# Patient Record
Sex: Male | Born: 1955 | Race: White | Hispanic: No | Marital: Married | Smoking: Former smoker
Health system: Southern US, Community
[De-identification: ages and names within clinical notes are randomized; demographics above are authoritative.]

## PROBLEM LIST (undated history)

## (undated) DIAGNOSIS — L0390 Cellulitis, unspecified: Secondary | ICD-10-CM

## (undated) DIAGNOSIS — E119 Type 2 diabetes mellitus without complications: Secondary | ICD-10-CM

## (undated) DIAGNOSIS — I10 Essential (primary) hypertension: Secondary | ICD-10-CM

## (undated) DIAGNOSIS — A4902 Methicillin resistant Staphylococcus aureus infection, unspecified site: Secondary | ICD-10-CM

## (undated) HISTORY — PX: VASECTOMY: SHX75

## (undated) HISTORY — PX: TONSILLECTOMY: SUR1361

---

## 2012-01-17 ENCOUNTER — Emergency Department (HOSPITAL_COMMUNITY)
Admission: EM | Admit: 2012-01-17 | Discharge: 2012-01-18 | Disposition: A | Discharge status: Home / Self Care | Payer: Self-pay | Attending: Emergency Medicine | Admitting: Emergency Medicine

## 2012-01-17 ENCOUNTER — Encounter (HOSPITAL_COMMUNITY): Payer: Self-pay | Admitting: Emergency Medicine

## 2012-01-17 DIAGNOSIS — I10 Essential (primary) hypertension: Secondary | ICD-10-CM | POA: Insufficient documentation

## 2012-01-17 DIAGNOSIS — Z8614 Personal history of Methicillin resistant Staphylococcus aureus infection: Secondary | ICD-10-CM | POA: Insufficient documentation

## 2012-01-17 DIAGNOSIS — E119 Type 2 diabetes mellitus without complications: Secondary | ICD-10-CM | POA: Insufficient documentation

## 2012-01-17 DIAGNOSIS — L0390 Cellulitis, unspecified: Secondary | ICD-10-CM

## 2012-01-17 DIAGNOSIS — X58XXXA Exposure to other specified factors, initial encounter: Secondary | ICD-10-CM | POA: Insufficient documentation

## 2012-01-17 DIAGNOSIS — Y9389 Activity, other specified: Secondary | ICD-10-CM | POA: Insufficient documentation

## 2012-01-17 DIAGNOSIS — Y929 Unspecified place or not applicable: Secondary | ICD-10-CM | POA: Insufficient documentation

## 2012-01-17 DIAGNOSIS — L02419 Cutaneous abscess of limb, unspecified: Secondary | ICD-10-CM | POA: Insufficient documentation

## 2012-01-17 DIAGNOSIS — Z79899 Other long term (current) drug therapy: Secondary | ICD-10-CM | POA: Insufficient documentation

## 2012-01-17 HISTORY — DX: Methicillin resistant Staphylococcus aureus infection, unspecified site: A49.02

## 2012-01-17 HISTORY — DX: Type 2 diabetes mellitus without complications: E11.9

## 2012-01-17 HISTORY — DX: Essential (primary) hypertension: I10

## 2012-01-17 HISTORY — DX: Cellulitis, unspecified: L03.90

## 2012-01-17 NOTE — ED Notes (Signed)
Pt states he skinned his left lower leg last week and he just wants it to be checked  Pt states he has a hx of MRSA and cellulitis  Pt states he has been keeping it covered and using antiseptic ointment on it

## 2012-01-18 MED ORDER — DOXYCYCLINE HYCLATE 100 MG PO TABS: 100.0000 mg | ORAL_TABLET | Freq: Two times a day (BID) | ORAL | Status: DC

## 2012-01-18 MED ORDER — DOXYCYCLINE HYCLATE 100 MG PO TABS
100.0000 mg | ORAL_TABLET | Freq: Once | ORAL | Status: AC
  Administered 2012-01-18: 100 mg via ORAL
  Filled 2012-01-18: qty 1

## 2012-01-18 MED ORDER — HYDROCODONE-ACETAMINOPHEN 5-500 MG PO TABS: 1.0000 | ORAL_TABLET | Freq: Four times a day (QID) | ORAL | Status: DC | PRN

## 2012-01-18 NOTE — ED Provider Notes (Signed)
Medical screening examination/treatment/procedure(s) were performed by non-physician practitioner and as supervising physician I was immediately available for consultation/collaboration.  Cheri Guppy, MD 01/18/12 [phone]

## 2012-01-18 NOTE — ED Provider Notes (Signed)
History     CSN: [id number]  Arrival date & time 01/17/12  2252   First MD Initiated Contact with Patient 01/18/12 0146      Chief Complaint  Patient presents with  . Leg Injury    (Consider location/radiation/quality/duration/timing/severity/associated sxs/prior treatment) HPI Comments: States that he has recurrent lower leg.  Infections, possibly 7 days ago.  He scraped the anterior portion of his left shin.  Since that time.  He has had some progressive redness, swelling, that he has been treating with Bactroban erythema.  Has spread to the entire lower leg with a small amount swelling.  He denies any fever, myalgias, change in his normal blood sugars, which run between 120 and 140 he does have a primary care physician, but has been unable to see them due to the physicians recent surgery.  The history is provided by the patient.    Past Medical History  Diagnosis Date  . MRSA (methicillin resistant Staphylococcus aureus)   . Cellulitis   . Diabetes mellitus without complication   . Hypertension     Past Surgical History  Procedure Date  . Tonsillectomy   . Vasectomy     Family History  Problem Relation Age of Onset  . Cancer Other   . Diabetes Other     History  Substance Use Topics  . Smoking status: Never Smoker   . Smokeless tobacco: Not on file  . Alcohol Use: Yes     occ      Review of Systems  Constitutional: Negative for fever and chills.  Musculoskeletal: Negative for myalgias and joint swelling.  Skin: Positive for wound. Negative for rash.  Neurological: Negative for dizziness.    Allergies  Azithromycin; Levaquin; and Penicillins  Home Medications   Current Outpatient Rx  Name Route Sig Dispense Refill  . CARVEDILOL 3.125 MG PO TABS Oral Take 3.125 mg by mouth 2 (two) times daily with a meal.    . COLESEVELAM HCL 625 MG PO TABS Oral Take 1,875 mg by mouth 2 (two) times daily with a meal.    . FENOFIBRATE 145 MG PO TABS Oral Take 145 mg  by mouth daily.    Marland Kitchen GABAPENTIN 100 MG PO CAPS Oral Take 100 mg by mouth 2 (two) times daily.    Marland Kitchen GLIPIZIDE 5 MG PO TABS Oral Take 2.5 mg by mouth 2 (two) times daily before a meal.    . HYDROCHLOROTHIAZIDE 25 MG PO TABS Oral Take 25 mg by mouth daily.    . MELOXICAM 15 MG PO TABS Oral Take 15 mg by mouth daily.    Marland Kitchen METFORMIN HCL 1000 MG PO TABS Oral Take 1,000 mg by mouth 2 (two) times daily with a meal.    . NIFEDIPINE ER 30 MG PO TB24 Oral Take 30 mg by mouth daily.    . QUINAPRIL HCL 20 MG PO TABS Oral Take 20 mg by mouth at bedtime.    Marland Kitchen DOXYCYCLINE HYCLATE 100 MG PO TABS Oral Take 1 tablet (100 mg total) by mouth 2 (two) times daily. 14 tablet 0  . HYDROCODONE-ACETAMINOPHEN 5-500 MG PO TABS Oral Take 1 tablet by mouth every 6 (six) hours as needed for pain. 15 tablet 0    BP 136/49  Pulse 90  Temp 98.4 F (36.9 C) (Oral)  Resp 20  SpO2 93%  Physical Exam  Constitutional: He appears well-developed and well-nourished.       Morbidly obese  HENT:  Head: Normocephalic.  Eyes: Pupils  are equal, round, and reactive to light.  Cardiovascular: Normal rate.   Pulmonary/Chest: Effort normal.  Musculoskeletal: He exhibits edema and tenderness.       Legs:      Area of erythema, anterior shin scabs over approximately 1 cm x 3 cm, mid shaft  Neurological: He is alert.  Skin: Skin is warm.    ED Course  Procedures (including critical care time)  Labs Reviewed - No data to display No results found.   1. Cellulitis       MDM  Cellulitis of the left lower leg, with some swelling.  Patient is refusing admission at this time, stating he needs to be in court tomorrow.  He, states that he will return for further treatment if needed.  If he does not respond to the by mouth antibiotics         Arman Filter, NP 01/18/12 0210  Arman Filter, NP 01/18/12 0210

## 2012-08-30 ENCOUNTER — Encounter: Payer: Self-pay | Admitting: Gastroenterology

## 2012-10-02 ENCOUNTER — Ambulatory Visit: Payer: Medicare Other | Admitting: Gastroenterology

## 2015-12-23 ENCOUNTER — Ambulatory Visit (HOSPITAL_COMMUNITY)
Admission: EM | Admit: 2015-12-23 | Discharge: 2015-12-23 | Disposition: A | Discharge status: Home / Self Care | Payer: Medicare Other | Attending: Family Medicine | Admitting: Family Medicine

## 2015-12-23 ENCOUNTER — Encounter (HOSPITAL_COMMUNITY): Payer: Self-pay | Admitting: Emergency Medicine

## 2015-12-23 DIAGNOSIS — J069 Acute upper respiratory infection, unspecified: Secondary | ICD-10-CM

## 2015-12-23 DIAGNOSIS — J0100 Acute maxillary sinusitis, unspecified: Secondary | ICD-10-CM | POA: Diagnosis not present

## 2015-12-23 MED ORDER — CEPHALEXIN 500 MG PO CAPS: 500.0000 mg | ORAL_CAPSULE | Freq: Four times a day (QID) | ORAL | 0 refills | Status: DC

## 2015-12-23 NOTE — ED Triage Notes (Signed)
The patient presented to the [facility] with a complaint of nasal pain and pressure that he stated started 2 days ago and started to move into his chest and cause chest congestion.

## 2015-12-24 NOTE — ED Provider Notes (Signed)
CSN: [id number]     Arrival date & time 12/23/15  1631 History   First MD Initiated Contact with Patient 12/23/15 1750     Chief Complaint  Patient presents with  . Cough  . Facial Pain   (Consider location/radiation/quality/duration/timing/severity/associated sxs/prior Treatment) HPI 60 y/o male with sinus pressure and drainage. No relief with home treatments. Unable to get an appointment with PCP. States his doctor usually treats with antibiotics. Non smoker but significant smoke exposure.  Past Medical History:  Diagnosis Date  . Cellulitis   . Diabetes mellitus without complication ([place])   . Hypertension   . MRSA (methicillin resistant Staphylococcus aureus)    Past Surgical History:  Procedure Laterality Date  . TONSILLECTOMY    . VASECTOMY     Family History  Problem Relation Age of Onset  . Cancer Other   . Diabetes Other    Social History  Substance Use Topics  . Smoking status: Never Smoker  . Smokeless tobacco: Not on file  . Alcohol use Yes     Comment: occ    Review of Systems  Denies: HEADACHE, NAUSEA, ABDOMINAL PAIN, CHEST PAIN,  DYSURIA, SHORTNESS OF BREATH  Allergies  Azithromycin; Levaquin [levofloxacin]; and Penicillins  Home Medications   Prior to Admission medications   Medication Sig Start Date End Date Taking? Authorizing Provider  carvedilol (COREG) 3.125 MG tablet Take 3.125 mg by mouth 2 (two) times daily with a meal.    Historical Provider, MD  cephALEXin (KEFLEX) 500 MG capsule Take 1 capsule (500 mg total) by mouth 4 (four) times daily. 12/23/15   Konrad Felix, PA  colesevelam [facility]) 625 MG tablet Take 1,875 mg by mouth 2 (two) times daily with a meal.    Historical Provider, MD  doxycycline (VIBRA-TABS) 100 MG tablet Take 1 tablet (100 mg total) by mouth 2 (two) times daily. 01/18/12   Junius Creamer, NP  fenofibrate (TRICOR) 145 MG tablet Take 145 mg by mouth daily.    Historical Provider, MD  gabapentin (NEURONTIN) 100 MG capsule  Take 100 mg by mouth 2 (two) times daily.    Historical Provider, MD  glipiZIDE (GLUCOTROL) 5 MG tablet Take 2.5 mg by mouth 2 (two) times daily before a meal.    Historical Provider, MD  hydrochlorothiazide (HYDRODIURIL) 25 MG tablet Take 25 mg by mouth daily.    Historical Provider, MD  HYDROcodone-acetaminophen (VICODIN) 5-500 MG per tablet Take 1 tablet by mouth every 6 (six) hours as needed for pain. 01/18/12   Junius Creamer, NP  meloxicam (MOBIC) 15 MG tablet Take 15 mg by mouth daily.    Historical Provider, MD  metFORMIN (GLUCOPHAGE) 1000 MG tablet Take 1,000 mg by mouth 2 (two) times daily with a meal.    Historical Provider, MD  NIFEdipine (PROCARDIA-XL/ADALAT CC) 30 MG 24 hr tablet Take 30 mg by mouth daily.    Historical Provider, MD  quinapril (ACCUPRIL) 20 MG tablet Take 20 mg by mouth at bedtime.    Historical Provider, MD   Meds Ordered and Administered this Visit  Medications - No data to display  BP 121/55 (BP Location: Left Arm)   Pulse 82   Temp 98.7 F (37.1 C) (Oral)   Resp 12   SpO2 98%  No data found.   Physical Exam NURSES NOTES AND VITAL SIGNS REVIEWED. CONSTITUTIONAL: Well developed, well nourished, no acute distress HEENT: normocephalic, atraumatic, bilateral maxillary tenderness.  EYES: Conjunctiva normal NECK:normal ROM, supple, no adenopathy PULMONARY:No respiratory distress, normal  effort ABDOMINAL: Soft, ND, NT BS+, No CVAT MUSCULOSKELETAL: Normal ROM of all extremities,  SKIN: warm and dry without rash PSYCHIATRIC: Mood and affect, behavior are normal  Urgent Care Course   Clinical Course    Procedures (including critical care time)  Labs Review Labs Reviewed - No data to display  Imaging Review No results found.   Visual Acuity Review  Right Eye Distance:   Left Eye Distance:   Bilateral Distance:    Right Eye Near:   Left Eye Near:    Bilateral Near:         MDM   1. Acute upper respiratory infection   2. Acute  maxillary sinusitis, recurrence not specified     Patient is reassured that there are no issues that require transfer to higher level of care at this time or additional tests. Patient is advised to continue home symptomatic treatment. Patient is advised that if there are new or worsening symptoms to attend the emergency department, contact primary care provider, or return to UC. Instructions of care provided discharged home in stable condition.    THIS NOTE WAS GENERATED USING A VOICE RECOGNITION SOFTWARE PROGRAM. ALL REASONABLE EFFORTS  WERE MADE TO PROOFREAD THIS DOCUMENT FOR ACCURACY.  I have verbally reviewed the discharge instructions with the patient. A printed AVS was given to the patient.  All questions were answered prior to discharge.      Konrad Felix, [place] 12/24/15 [phone]

## 2016-01-01 ENCOUNTER — Emergency Department (HOSPITAL_BASED_OUTPATIENT_CLINIC_OR_DEPARTMENT_OTHER)
Admission: EM | Admit: 2016-01-01 | Discharge: 2016-01-01 | Disposition: A | Discharge status: Home / Self Care | Payer: Medicare Other | Attending: Emergency Medicine | Admitting: Emergency Medicine

## 2016-01-01 ENCOUNTER — Encounter (HOSPITAL_BASED_OUTPATIENT_CLINIC_OR_DEPARTMENT_OTHER): Payer: Self-pay | Admitting: *Deleted

## 2016-01-01 DIAGNOSIS — I10 Essential (primary) hypertension: Secondary | ICD-10-CM | POA: Insufficient documentation

## 2016-01-01 DIAGNOSIS — Z79899 Other long term (current) drug therapy: Secondary | ICD-10-CM | POA: Insufficient documentation

## 2016-01-01 DIAGNOSIS — Z7984 Long term (current) use of oral hypoglycemic drugs: Secondary | ICD-10-CM | POA: Insufficient documentation

## 2016-01-01 DIAGNOSIS — L03115 Cellulitis of right lower limb: Secondary | ICD-10-CM | POA: Diagnosis not present

## 2016-01-01 DIAGNOSIS — E119 Type 2 diabetes mellitus without complications: Secondary | ICD-10-CM | POA: Diagnosis not present

## 2016-01-01 DIAGNOSIS — M79604 Pain in right leg: Secondary | ICD-10-CM | POA: Diagnosis present

## 2016-01-01 LAB — CBG MONITORING, ED: Glucose-Capillary: 234 mg/dL — ABNORMAL HIGH (ref 65–99)

## 2016-01-01 MED ORDER — DOXYCYCLINE HYCLATE 100 MG PO TABS
100.0000 mg | ORAL_TABLET | Freq: Once | ORAL | Status: AC
  Administered 2016-01-01: 100 mg via ORAL
  Filled 2016-01-01: qty 1

## 2016-01-01 MED ORDER — DOXYCYCLINE HYCLATE 100 MG PO CAPS: 100.0000 mg | ORAL_CAPSULE | Freq: Two times a day (BID) | ORAL | 0 refills | Status: DC

## 2016-01-01 NOTE — ED Notes (Signed)
C/o rt lower leg pain  Area red and swollen on shin onset yesterday

## 2016-01-01 NOTE — ED Provider Notes (Signed)
[place] DEPT [facility] Provider Note   CSN: [id number] Arrival date & time: 01/01/16  2013    By signing my name below, I, Randall Morgan, attest that this documentation has been prepared under the direction and in the presence of Jola Schmidt, MD. Electronically signed, Randall Morgan, ED Scribe. 01/01/16. 8:51 PM.   History   Chief Complaint Chief Complaint  Patient presents with  . Leg Pain    The history is provided by the patient. No language interpreter was used.   HPI Comments: Randall Morgan is a 60 y.o. male who has a pmhx of DM and cellulitis presents to the Emergency Department complaining of Erythema of his right lower leg  x 24 hours after wresting with pets. Pt has associated right leg pain. He states that he has had cellulitis in the past and that his current symptoms feel the same. He is currently taking Keflex for a URI. He denies drainage or fever. The pt is allergic to septra and levaquin, but he can take doxycycline. No other associates symptoms. Pain is mild in severity   Past Medical History:  Diagnosis Date  . Cellulitis   . Diabetes mellitus without complication ([place])   . Hypertension   . MRSA (methicillin resistant Staphylococcus aureus)     There are no active problems to display for this patient.   Past Surgical History:  Procedure Laterality Date  . TONSILLECTOMY    . VASECTOMY         Home Medications    Prior to Admission medications   Medication Sig Start Date End Date Taking? Authorizing Provider  carvedilol (COREG) 3.125 MG tablet Take 3.125 mg by mouth 2 (two) times daily with a meal.    Historical Provider, MD  cephALEXin (KEFLEX) 500 MG capsule Take 1 capsule (500 mg total) by mouth 4 (four) times daily. 12/23/15   Konrad Felix, PA  colesevelam [facility]) 625 MG tablet Take 1,875 mg by mouth 2 (two) times daily with a meal.    Historical Provider, MD  doxycycline (VIBRA-TABS) 100 MG tablet Take 1 tablet (100 mg total) by mouth 2  (two) times daily. 01/18/12   Junius Creamer, NP  fenofibrate (TRICOR) 145 MG tablet Take 145 mg by mouth daily.    Historical Provider, MD  gabapentin (NEURONTIN) 100 MG capsule Take 100 mg by mouth 2 (two) times daily.    Historical Provider, MD  glipiZIDE (GLUCOTROL) 5 MG tablet Take 2.5 mg by mouth 2 (two) times daily before a meal.    Historical Provider, MD  hydrochlorothiazide (HYDRODIURIL) 25 MG tablet Take 25 mg by mouth daily.    Historical Provider, MD  HYDROcodone-acetaminophen (VICODIN) 5-500 MG per tablet Take 1 tablet by mouth every 6 (six) hours as needed for pain. 01/18/12   Junius Creamer, NP  meloxicam (MOBIC) 15 MG tablet Take 15 mg by mouth daily.    Historical Provider, MD  metFORMIN (GLUCOPHAGE) 1000 MG tablet Take 1,000 mg by mouth 2 (two) times daily with a meal.    Historical Provider, MD  NIFEdipine (PROCARDIA-XL/ADALAT CC) 30 MG 24 hr tablet Take 30 mg by mouth daily.    Historical Provider, MD  quinapril (ACCUPRIL) 20 MG tablet Take 20 mg by mouth at bedtime.    Historical Provider, MD    Family History Family History  Problem Relation Age of Onset  . Cancer Other   . Diabetes Other     Social History Social History  Substance Use Topics  . Smoking status:  Never Smoker  . Smokeless tobacco: Never Used  . Alcohol use Yes     Comment: occ     Allergies   Azithromycin; Levaquin [levofloxacin]; and Penicillins   Review of Systems Review of Systems  A complete 10 system review of systems was obtained and all systems are negative except as noted in the HPI and PMH.   Physical Exam Updated Vital Signs BP 157/65   Pulse 94   Temp 97.7 F (36.5 C) (Oral)   Resp 20   Ht 5\' 7"  (1.702 m)   Wt (!) 316 lb (143.3 kg)   SpO2 94%   BMI 49.49 kg/m   Physical Exam  Constitutional: He is oriented to person, place, and time. He appears well-developed and well-nourished.  HENT:  Head: Normocephalic.  Eyes: EOM are normal.  Neck: Normal range of motion.    Pulmonary/Chest: Effort normal.  Abdominal: He exhibits no distension.  Musculoskeletal: Normal range of motion.  Focal erythema of the mid anterior right tibial region. No drainage. No fluctuance. No crepitus. Normal pulses right foot. Compartments of right lower extremity are soft. This area is silver dollar sized.  Neurological: He is alert and oriented to person, place, and time.  Psychiatric: He has a normal mood and affect.  Nursing note and vitals reviewed.    ED Treatments / Results  DIAGNOSTIC STUDIES: Oxygen Saturation is 94% on RA, low by my interpretation.    COORDINATION OF CARE: 8:51 PM Will order medication. Discussed treatment plan with pt at bedside and pt agreed to plan.    Labs (all labs ordered are listed, but only abnormal results are displayed) Labs Reviewed - No data to display  EKG  EKG Interpretation None       Radiology No results found.  Procedures Procedures (including critical care time)  Medications Ordered in ED Medications - No data to display   Initial Impression / Assessment and Plan / ED Course  I have reviewed the triage vital signs and the nursing notes.  Pertinent labs & imaging results that were available during my care of the patient were reviewed by me and considered in my medical decision making (see chart for details).  Clinical Course    Overall well-appearing. No signs of suggest abscess at this time. Appears to be a superficial cellulitis. Patient be started on doxycycline. The erythema was outlined with a skin marker. He understands to return to the ER for new or worsening symptoms. I've asked him to take a picture of this when he returns home for further documentation purposes to see if the infection is worsening. He understands to return to the ER for new or worsening symptoms   I personally performed the services described in this documentation, which was scribed in my presence. The recorded information has been  reviewed and is accurate.      Final Clinical Impressions(s) / ED Diagnoses   Final diagnoses:  Cellulitis of right lower leg    New Prescriptions New Prescriptions   No medications on file     Jola Schmidt, MD 01/01/16 2104

## 2016-01-01 NOTE — ED Triage Notes (Signed)
Right lower leg is painful. Hx of cellulitis in the past. He also has a hx of MRSA.

## 2016-04-13 ENCOUNTER — Emergency Department (HOSPITAL_BASED_OUTPATIENT_CLINIC_OR_DEPARTMENT_OTHER)
Admission: EM | Admit: 2016-04-13 | Discharge: 2016-04-14 | Disposition: A | Discharge status: Home / Self Care | Payer: Medicare Other | Attending: Emergency Medicine | Admitting: Emergency Medicine

## 2016-04-13 ENCOUNTER — Emergency Department (HOSPITAL_BASED_OUTPATIENT_CLINIC_OR_DEPARTMENT_OTHER): Payer: Medicare Other

## 2016-04-13 ENCOUNTER — Encounter (HOSPITAL_BASED_OUTPATIENT_CLINIC_OR_DEPARTMENT_OTHER): Payer: Self-pay | Admitting: Emergency Medicine

## 2016-04-13 DIAGNOSIS — Y929 Unspecified place or not applicable: Secondary | ICD-10-CM | POA: Diagnosis not present

## 2016-04-13 DIAGNOSIS — W109XXA Fall (on) (from) unspecified stairs and steps, initial encounter: Secondary | ICD-10-CM | POA: Insufficient documentation

## 2016-04-13 DIAGNOSIS — Z7984 Long term (current) use of oral hypoglycemic drugs: Secondary | ICD-10-CM | POA: Diagnosis not present

## 2016-04-13 DIAGNOSIS — E119 Type 2 diabetes mellitus without complications: Secondary | ICD-10-CM | POA: Diagnosis not present

## 2016-04-13 DIAGNOSIS — Y9301 Activity, walking, marching and hiking: Secondary | ICD-10-CM | POA: Diagnosis not present

## 2016-04-13 DIAGNOSIS — S5002XA Contusion of left elbow, initial encounter: Secondary | ICD-10-CM | POA: Insufficient documentation

## 2016-04-13 DIAGNOSIS — I10 Essential (primary) hypertension: Secondary | ICD-10-CM | POA: Diagnosis not present

## 2016-04-13 DIAGNOSIS — S61011A Laceration without foreign body of right thumb without damage to nail, initial encounter: Secondary | ICD-10-CM | POA: Insufficient documentation

## 2016-04-13 DIAGNOSIS — W108XXA Fall (on) (from) other stairs and steps, initial encounter: Secondary | ICD-10-CM

## 2016-04-13 DIAGNOSIS — Z79899 Other long term (current) drug therapy: Secondary | ICD-10-CM | POA: Insufficient documentation

## 2016-04-13 DIAGNOSIS — Y999 Unspecified external cause status: Secondary | ICD-10-CM | POA: Diagnosis not present

## 2016-04-13 DIAGNOSIS — T07XXXA Unspecified multiple injuries, initial encounter: Secondary | ICD-10-CM

## 2016-04-13 DIAGNOSIS — W548XXA Other contact with dog, initial encounter: Secondary | ICD-10-CM

## 2016-04-13 MED ORDER — DOXYCYCLINE HYCLATE 100 MG PO TABS
100.0000 mg | ORAL_TABLET | Freq: Once | ORAL | Status: AC
  Administered 2016-04-13: 100 mg via ORAL
  Filled 2016-04-13: qty 1

## 2016-04-13 MED ORDER — HYDROCODONE-ACETAMINOPHEN 5-325 MG PO TABS
1.0000 | ORAL_TABLET | Freq: Once | ORAL | Status: AC
  Administered 2016-04-13: 1 via ORAL
  Filled 2016-04-13: qty 1

## 2016-04-13 MED ORDER — TETANUS-DIPHTH-ACELL PERTUSSIS 5-2.5-18.5 LF-MCG/0.5 IM SUSP
0.5000 mL | Freq: Once | INTRAMUSCULAR | Status: AC
  Administered 2016-04-13: 0.5 mL via INTRAMUSCULAR
  Filled 2016-04-13: qty 0.5

## 2016-04-13 MED ORDER — LIDOCAINE HCL (PF) 2 % IJ SOLN
INTRAMUSCULAR | Status: AC
  Filled 2016-04-13: qty 2

## 2016-04-13 MED ORDER — DOXYCYCLINE HYCLATE 100 MG PO CAPS: 100.0000 mg | ORAL_CAPSULE | Freq: Two times a day (BID) | ORAL | 0 refills | Status: DC

## 2016-04-13 NOTE — ED Notes (Signed)
Patient given instructions and wound care done.

## 2016-04-13 NOTE — ED Triage Notes (Addendum)
Pt has laceration to R thumb caused by dog leash. Pt was also pulled to the ground by the dog and reports bilat knee pain.

## 2016-04-13 NOTE — ED Provider Notes (Signed)
[place] DEPT [facility] Provider Note: Randall Spurling, MD, FACEP  By signing my name below, I, Dolores Hoose, attest that this documentation has been prepared under the direction and in the presence of Shanon Rosser, MD . Electronically Signed: Dolores Hoose, Scribe. 04/13/2016. 10:57 PM.  CSN: [id number] MRN: [id number] ARRIVAL: 04/13/16 at 1934 ROOM: [place] [facility]  Randall Morgan is a 61 y.o. male with pmhx of DM, MRSA, and cellulitis who presents to the Emergency Department complaining of an injury to his right thumb that occurred earlier today. Pt reports that he was walking his dog when the leash became caught on his hand as the dog pulled. A carabiner was caught on the dorsal surface of his right thumb lacerating it. There is an irregular flap laceration at that location. He denies any numbness or weakness of the thumb distally. When he fell he landed on his left elbow and knees bilaterally. He has mild pain to the sites. He denies neck or back pain. Bleeding is controlled. He is not up-to-date on tetanus. Pain is moderate, worse with movement or palpation of the thumb.  Past Medical History:  Diagnosis Date  . Cellulitis   . Diabetes mellitus without complication ([place])   . Hypertension   . MRSA (methicillin resistant Staphylococcus aureus)     Past Surgical History:  Procedure Laterality Date  . TONSILLECTOMY    . VASECTOMY     Family History  Problem Relation Age of Onset  . Cancer Other   . Diabetes Other    Social History  Substance Use Topics  . Smoking status: Never Smoker  . Smokeless tobacco: Never Used  . Alcohol use Yes     Comment: occ   Prior to Admission medications   Medication Sig Start Date End Date Taking? Authorizing Provider  carvedilol (COREG) 3.125 MG tablet Take 3.125 mg by mouth 2 (two) times daily with a meal.   Yes Historical Provider, MD  fenofibrate (TRICOR) 145 MG tablet Take 145  mg by mouth daily.   Yes Historical Provider, MD  gabapentin (NEURONTIN) 100 MG capsule Take 100 mg by mouth 2 (two) times daily.   Yes Historical Provider, MD  glipiZIDE (GLUCOTROL) 5 MG tablet Take 2.5 mg by mouth 2 (two) times daily before a meal.   Yes Historical Provider, MD  hydrochlorothiazide (HYDRODIURIL) 25 MG tablet Take 25 mg by mouth daily.   Yes Historical Provider, MD  meloxicam (MOBIC) 15 MG tablet Take 15 mg by mouth daily.   Yes Historical Provider, MD  metFORMIN (GLUCOPHAGE) 1000 MG tablet Take 1,000 mg by mouth 2 (two) times daily with a meal.   Yes Historical Provider, MD  quinapril (ACCUPRIL) 20 MG tablet Take 20 mg by mouth at bedtime.   Yes Historical Provider, MD  colesevelam (WELCHOL) 625 MG tablet Take 1,875 mg by mouth 2 (two) times daily with a meal.    Historical Provider, MD  doxycycline (VIBRAMYCIN) 100 MG capsule Take 1 capsule (100 mg total) by mouth 2 (two) times daily. One po bid x 7 days 04/13/16   Shanon Rosser, MD  NIFEdipine (PROCARDIA-XL/ADALAT CC) 30 MG 24 hr tablet Take 30 mg by mouth daily.    Historical Provider, MD    Allergies Azithromycin; Levaquin [levofloxacin]; Penicillins; and Septra [sulfamethoxazole-trimethoprim]   REVIEW OF SYSTEMS  Negative except as noted here or in the History of Present Illness.   PHYSICAL EXAMINATION  Initial Vital Signs Blood  pressure 125/62, pulse 81, temperature 98.3 F (36.8 C), temperature source Oral, resp. rate 20, height 5\' 5"  (1.651 m), weight (!) 310 lb (140.6 kg), SpO2 97 %.  Examination General: Well-developed, well-nourished male in no acute distress; appearance consistent with age of record HENT: normocephalic; atraumatic Eyes: pupils equal, round and reactive to light; extraocular muscles intact Neck: supple; no c-spine tenderness Heart: regular rate and rhythm Lungs: clear to auscultation bilaterally Abdomen: soft; nondistended; nontender; bowel sounds present Back: no spinal  tenderness Extremities: No deformity; full range of motion; pulses normal; mild tenderness over patellas bilaterally; mild tenderness and ecchymosis over left olecranon without bony deformity Neurologic: Awake, alert and oriented; motor function intact in all extremities and symmetric; no facial droop Skin: Warm and dry; flap laceration overlying the dorsal IP joint of the right thumb with intact tendon function, intact sensation and brisk capillary refill distally Psychiatric: Normal mood and affect   RESULTS  Summary of this visit's results, reviewed by myself:   EKG Interpretation  Date/Time:    Ventricular Rate:    PR Interval:    QRS Duration:   QT Interval:    QTC Calculation:   R Axis:     Text Interpretation:        Laboratory Studies: No results found for this or any previous visit (from the past 24 hour(s)). Imaging Studies: Dg Finger Thumb Right  Result Date: 04/13/2016 CLINICAL DATA:  Thumb laceration due to dog leash. EXAM: RIGHT THUMB 2+V COMPARISON:  None. FINDINGS: There is no evidence of fracture or dislocation. There is no evidence of arthropathy or other focal bone abnormality. Bandage over the first digit without subcutaneous gas or radiopaque foreign bodies. IMPRESSION: Negative. Electronically Signed   By: Elon Alas M.D.   On: 04/13/2016 20:52   ED COURSE  Nursing notes and initial vitals signs, including pulse oximetry, reviewed.  Vitals:   04/13/16 1942 04/13/16 1943 04/13/16 2154  BP: 142/73  125/62  Pulse: 88  81  Resp: 16  20  Temp: 98.3 F (36.8 C)    TempSrc: Oral    SpO2: 97%  97%  Weight:  (!) 310 lb (140.6 kg)   Height:  5\' 5"  (1.651 m)     PROCEDURES   LACERATION REPAIR Performed by: Wynetta Fines Authorized by: Wynetta Fines Consent: Verbal consent obtained. Risks and benefits: risks, benefits and alternatives were discussed Consent given by: patient Patient identity confirmed: provided demographic data Prepped and  Draped in normal sterile fashion Wound explored  Laceration Location: Right thumb  Laceration Length: 2 cm (flap)  No Foreign Bodies seen or palpated  Anesthesia: local infiltration  Local anesthetic: lidocaine 2 % without epinephrine  Anesthetic total: 3 ml  Irrigation method: syringe Amount of cleaning: standard  Skin closure: 4-0 Prolene   Number of sutures: 6   Technique: Simple interrupted   Patient tolerance: Patient tolerated the procedure well with no immediate complications.       ED DIAGNOSES     ICD-9-CM ICD-10-CM   1. Laceration of right thumb without foreign body without damage to nail, initial encounter 883.0 S61.011A   2. Other contact with dog, initial encounter E906.8 W54.8XXA   3. Contusion, multiple sites 924.8 T07.XXXA   4. Fall on steps, initial encounter E880.9 W10.8XXA     I personally performed the services described in this documentation, which was scribed in my presence. The recorded information has been reviewed and is accurate.     Shanon Rosser, MD 04/13/16 [phone]

## 2016-04-13 NOTE — ED Notes (Signed)
ED Provider at bedside. 

## 2016-11-29 ENCOUNTER — Encounter (HOSPITAL_COMMUNITY): Payer: Self-pay | Admitting: *Deleted

## 2016-11-29 DIAGNOSIS — E119 Type 2 diabetes mellitus without complications: Secondary | ICD-10-CM | POA: Insufficient documentation

## 2016-11-29 DIAGNOSIS — Y929 Unspecified place or not applicable: Secondary | ICD-10-CM | POA: Insufficient documentation

## 2016-11-29 DIAGNOSIS — X58XXXA Exposure to other specified factors, initial encounter: Secondary | ICD-10-CM | POA: Diagnosis not present

## 2016-11-29 DIAGNOSIS — S81801A Unspecified open wound, right lower leg, initial encounter: Secondary | ICD-10-CM | POA: Insufficient documentation

## 2016-11-29 DIAGNOSIS — I10 Essential (primary) hypertension: Secondary | ICD-10-CM | POA: Diagnosis not present

## 2016-11-29 DIAGNOSIS — Y999 Unspecified external cause status: Secondary | ICD-10-CM | POA: Diagnosis not present

## 2016-11-29 DIAGNOSIS — S81811A Laceration without foreign body, right lower leg, initial encounter: Secondary | ICD-10-CM | POA: Diagnosis present

## 2016-11-29 DIAGNOSIS — Y939 Activity, unspecified: Secondary | ICD-10-CM | POA: Diagnosis not present

## 2016-11-29 DIAGNOSIS — Z7984 Long term (current) use of oral hypoglycemic drugs: Secondary | ICD-10-CM | POA: Insufficient documentation

## 2016-11-29 NOTE — ED Triage Notes (Signed)
Pt was drying his legs off after a shower and noticed blood spraying from the R lower leg; bandage in place in triage, bleeding controlled.

## 2016-11-30 ENCOUNTER — Emergency Department (HOSPITAL_COMMUNITY)
Admission: EM | Admit: 2016-11-30 | Discharge: 2016-11-30 | Disposition: A | Discharge status: Home / Self Care | Payer: Medicare Other | Attending: Emergency Medicine | Admitting: Emergency Medicine

## 2016-11-30 DIAGNOSIS — S81801A Unspecified open wound, right lower leg, initial encounter: Secondary | ICD-10-CM

## 2016-11-30 NOTE — Discharge Instructions (Signed)
As discussed, your evaluation today has been largely reassuring.  But, it is important that you monitor your condition carefully, and do not hesitate to return to the ED if you develop new, or concerning changes in your condition.  Otherwise, please follow-up with your physician for appropriate ongoing care.  In regards to your dental pain, it is important to use the provided resources to follow-up with a dentist. In the interim, please use Peridex, dental rinse, as directed.

## 2016-11-30 NOTE — ED Provider Notes (Signed)
[facility] DEPT Provider Note   CSN: [id number] Arrival date & time: 11/29/16  2336     History   Chief Complaint Chief Complaint  Patient presents with  . Extremity Laceration    HPI Randall Morgan is a 61 y.o. male.  HPI  He presents after an episode of bleeding from his right calf. Patient is unsure of what happened, but about 2 hours prior to my evaluation the patient developed bleeding from his right medial distal calf He notes a steady stream of blood, controlled by pressure. Now, on my evaluation the patient has no active bleeding. He has no pain in this area, no distal complaints including numbness or weakness. Patient also notes incidental ongoing pain and swelling in his right posterior mouth, and he has not seen a dentist yet.  Past Medical History:  Diagnosis Date  . Cellulitis   . Diabetes mellitus without complication ([place])   . Hypertension   . MRSA (methicillin resistant Staphylococcus aureus)     There are no active problems to display for this patient.   Past Surgical History:  Procedure Laterality Date  . TONSILLECTOMY    . VASECTOMY         Home Medications    Prior to Admission medications   Medication Sig Start Date End Date Taking? Authorizing Provider  carvedilol (COREG) 3.125 MG tablet Take 3.125 mg by mouth 2 (two) times daily with a meal.    [provider]  colesevelam (WELCHOL) 625 MG tablet Take 1,875 mg by mouth 2 (two) times daily with a meal.    [provider]  doxycycline (VIBRAMYCIN) 100 MG capsule Take 1 capsule (100 mg total) by mouth 2 (two) times daily. One po bid x 7 days 04/13/16   Molpus, John, MD  fenofibrate (TRICOR) 145 MG tablet Take 145 mg by mouth daily.    [provider]  gabapentin (NEURONTIN) 100 MG capsule Take 100 mg by mouth 2 (two) times daily.    [provider]  glipiZIDE (GLUCOTROL) 5 MG tablet Take 2.5 mg by mouth 2 (two) times daily before a meal.    [provider]  hydrochlorothiazide (HYDRODIURIL) 25 MG tablet Take 25 mg by mouth daily.    [provider]  meloxicam (MOBIC) 15 MG tablet Take 15 mg by mouth daily.    [provider]  metFORMIN (GLUCOPHAGE) 1000 MG tablet Take 1,000 mg by mouth 2 (two) times daily with a meal.    [provider]  NIFEdipine (PROCARDIA-XL/ADALAT CC) 30 MG 24 hr tablet Take 30 mg by mouth daily.    [provider]  quinapril (ACCUPRIL) 20 MG tablet Take 20 mg by mouth at bedtime.    [provider]    Family History Family History  Problem Relation Age of Onset  . Cancer Other   . Diabetes Other     Social History Social History  Substance Use Topics  . Smoking status: Never Smoker  . Smokeless tobacco: Never Used  . Alcohol use Yes     Comment: occ     Allergies   Azithromycin; Levaquin [levofloxacin]; Penicillins; and Septra [sulfamethoxazole-trimethoprim]   Review of Systems Review of Systems  Constitutional: Negative for fever.  Respiratory: Negative for shortness of breath.   Cardiovascular: Negative for chest pain.  Musculoskeletal:       Negative aside from HPI  Skin:       Negative aside from HPI  Allergic/Immunologic: Negative for immunocompromised state.  Neurological: Negative  for weakness.  Hematological: Does not bruise/bleed easily.     Physical Exam Updated Vital Signs BP 136/63 (BP Location: Left Arm)   Pulse 84   Temp 98.2 F (36.8 C) (Oral)   Resp 18   Ht 5\' 6"  (1.676 m)   Wt (!) 139.7 kg (308 lb)   SpO2 97%   BMI 49.71 kg/m   Physical Exam  Constitutional: He is oriented to person, place, and time. He appears well-developed. No distress.  HENT:  Head: Normocephalic and atraumatic.  Mouth/Throat:    Eyes: Conjunctivae and EOM are normal.  Pulmonary/Chest: Effort normal. No stridor. No respiratory distress.  Musculoskeletal: He exhibits no edema.  Neurological: He is alert and oriented to person, place,  and time.  Skin: Skin is warm and dry.     Psychiatric: He has a normal mood and affect.  Nursing note and vitals reviewed.    ED Treatments / Results   Procedures Procedures (including critical care time)   Initial Impression / Assessment and Plan / ED Course  I have reviewed the triage vital signs and the nursing notes.  Pertinent labs & imaging results that were available during my care of the patient were reviewed by me and considered in my medical decision making (see chart for details).  Well-appearing male presents after an episode of bleeding. Patient is not anticoagulated, is generally well, has no ongoing bleeding, nor distal loss of neurovascular status. Patient had his wound dressed again, will keep it covered, with pressure until tomorrow With reassuring findings, patient is appropriate for discharge. Incidentally, the patient also completes of dental pain, but has no evidence for infection or abscess. Patient will use a topical antibiotic rinse, follow up with dentist.   Final Clinical Impressions(s) / ED Diagnoses   Final diagnoses:  Leg wound, right, initial encounter     Carmin Muskrat, MD 11/30/16 [phone]

## 2016-11-30 NOTE — ED Notes (Signed)
Lockwood MD at bedside. 

## 2016-11-30 NOTE — ED Notes (Signed)
No bleeding observed.  Dressing taken off.

## 2016-11-30 NOTE — ED Notes (Signed)
Patient Alert and oriented X4. Stable and ambulatory. Patient verbalized understanding of the discharge instructions.  Patient belongings were taken by the patient.  

## 2017-12-02 ENCOUNTER — Encounter: Payer: Self-pay | Admitting: Gastroenterology

## 2017-12-02 ENCOUNTER — Other Ambulatory Visit (INDEPENDENT_AMBULATORY_CARE_PROVIDER_SITE_OTHER): Payer: Medicare Other

## 2017-12-02 ENCOUNTER — Ambulatory Visit (INDEPENDENT_AMBULATORY_CARE_PROVIDER_SITE_OTHER): Payer: Medicare Other | Admitting: Gastroenterology

## 2017-12-02 ENCOUNTER — Encounter (INDEPENDENT_AMBULATORY_CARE_PROVIDER_SITE_OTHER): Payer: Self-pay

## 2017-12-02 VITALS — BP 126/74 | HR 70 | Ht 66.0 in | Wt 319.2 lb

## 2017-12-02 DIAGNOSIS — D509 Iron deficiency anemia, unspecified: Secondary | ICD-10-CM

## 2017-12-02 DIAGNOSIS — K921 Melena: Secondary | ICD-10-CM

## 2017-12-02 DIAGNOSIS — Z6841 Body Mass Index (BMI) 40.0 and over, adult: Secondary | ICD-10-CM

## 2017-12-02 LAB — FOLATE

## 2017-12-02 LAB — CBC WITH DIFFERENTIAL/PLATELET
BASOS ABS: 0.1 10*3/uL (ref 0.0–0.1)
Basophils Relative: 1.2 % (ref 0.0–3.0)
Eosinophils Absolute: 0.4 10*3/uL (ref 0.0–0.7)
Eosinophils Relative: 4.4 % (ref 0.0–5.0)
HCT: 38.3 % — ABNORMAL LOW (ref 39.0–52.0)
HEMOGLOBIN: 12.7 g/dL — AB (ref 13.0–17.0)
Lymphocytes Relative: 28.3 % (ref 12.0–46.0)
Lymphs Abs: 2.3 10*3/uL (ref 0.7–4.0)
MCHC: 33.2 g/dL (ref 30.0–36.0)
MCV: 79.1 fl (ref 78.0–100.0)
MONO ABS: 0.5 10*3/uL (ref 0.1–1.0)
Monocytes Relative: 6 % (ref 3.0–12.0)
Neutro Abs: 4.9 10*3/uL (ref 1.4–7.7)
Neutrophils Relative %: 60.1 % (ref 43.0–77.0)
Platelets: 282 10*3/uL (ref 150.0–400.0)
RBC: 4.84 Mil/uL (ref 4.22–5.81)
RDW: 15.1 % (ref 11.5–15.5)
WBC: 8.1 10*3/uL (ref 4.0–10.5)

## 2017-12-02 LAB — IBC PANEL
IRON: 44 ug/dL (ref 42–165)
Saturation Ratios: 9.7 % — ABNORMAL LOW (ref 20.0–50.0)
TRANSFERRIN: 325 mg/dL (ref 212.0–360.0)

## 2017-12-02 LAB — IRON: Iron: 44 ug/dL (ref 42–165)

## 2017-12-02 LAB — VITAMIN B12: VITAMIN B 12: 576 pg/mL (ref 211–911)

## 2017-12-02 LAB — FERRITIN: FERRITIN: 16.5 ng/mL — AB (ref 22.0–322.0)

## 2017-12-02 MED ORDER — NA SULFATE-K SULFATE-MG SULF 17.5-3.13-1.6 GM/177ML PO SOLN: 1.0000 | Freq: Once | ORAL | 0 refills | Status: AC

## 2017-12-02 MED ORDER — NA SULFATE-K SULFATE-MG SULF 17.5-3.13-1.6 GM/177ML PO SOLN: 1.0000 | Freq: Once | ORAL | 0 refills | Status: DC

## 2017-12-02 NOTE — Patient Instructions (Signed)
Your provider has requested that you go to the basement level for lab work before leaving today. Press "B" on the elevator. The lab is located at the first door on the left as you exit the elevator.  You have been scheduled for an endoscopy and colonoscopy. Please follow the written instructions given to you at your visit today. Please pick up your prep supplies at the pharmacy within the next 1-3 days. If you use inhalers (even only as needed), please bring them with you on the day of your procedure. Your physician has requested that you go to www.startemmi.com and enter the access code given to you at your visit today. This web site gives a general overview about your procedure. However, you should still follow specific instructions given to you by our office regarding your preparation for the procedure.  Normal BMI (Body Mass Index- based on height and weight) is between 19 and 25. Your BMI today is Body mass index is 51.53 kg/m. Marland Kitchen Please consider follow up  regarding your BMI with your Primary Care Provider.  Thank you for choosing me and [facility].  Pricilla Riffle. Dagoberto Ligas., MD., Marval Regal

## 2017-12-02 NOTE — Progress Notes (Signed)
History of Present Illness: This is a 62 year old male referred by Randall Jewel, MD for the evaluation of a microcytic anemia, occasional rectal bleeding, occasional constipation. Takes meloxicam and vicodin daily.  He notes occasional mild constipation and occasional small amounts of rectal bleeding with hard, constipated stools.  No other gastrointestinal complaints.  No prior colonoscopy. Denies weight loss, abdominal pain, diarrhea, change in stool caliber, melena, nausea, vomiting, dysphagia, reflux symptoms, chest pain.    Allergies  Allergen Reactions  . Azithromycin   . Levaquin [Levofloxacin]   . Penicillins   . Septra [Sulfamethoxazole-Trimethoprim]    Outpatient Medications Prior to Visit  Medication Sig Dispense Refill  . amLODipine (NORVASC) 10 MG tablet Take 10 mg by mouth daily.    . carvedilol (COREG) 3.125 MG tablet Take 3.125 mg by mouth 2 (two) times daily with a meal.    . fenofibrate (TRICOR) 145 MG tablet Take 145 mg by mouth daily.    Marland Kitchen gabapentin (NEURONTIN) 100 MG capsule Take 100 mg by mouth 2 (two) times daily.    . Garlic 10 MG CAPS Take by mouth daily.    Marland Kitchen glipiZIDE (GLUCOTROL) 5 MG tablet Take 2.5 mg by mouth 2 (two) times daily before a meal.    . hydrALAZINE (APRESOLINE) 50 MG tablet Take 50 mg by mouth 3 (three) times daily.    . hydrochlorothiazide (HYDRODIURIL) 25 MG tablet Take 25 mg by mouth daily.    Marland Kitchen HYDROcodone-acetaminophen (NORCO/VICODIN) 5-325 MG tablet Take 1 tablet by mouth every 6 (six) hours as needed for moderate pain.    Marland Kitchen loratadine (CLARITIN) 10 MG tablet Take 10 mg by mouth daily.    . meloxicam (MOBIC) 15 MG tablet Take 15 mg by mouth daily.    . metFORMIN (GLUCOPHAGE) 1000 MG tablet Take 1,000 mg by mouth 2 (two) times daily with a meal.    . Multiple Vitamin (MULTIVITAMIN) capsule Take 1 capsule by mouth daily.    . quinapril (ACCUPRIL) 20 MG tablet Take 20 mg by mouth at bedtime.    . colesevelam (WELCHOL) 625 MG tablet Take  1,875 mg by mouth 2 (two) times daily with a meal.    . doxycycline (VIBRAMYCIN) 100 MG capsule Take 1 capsule (100 mg total) by mouth 2 (two) times daily. One po bid x 7 days 10 capsule 0  . NIFEdipine (PROCARDIA-XL/ADALAT CC) 30 MG 24 hr tablet Take 30 mg by mouth daily.     No facility-administered medications prior to visit.    Past Medical History:  Diagnosis Date  . Cellulitis   . Diabetes mellitus without complication ([place])   . Hypertension   . MRSA (methicillin resistant Staphylococcus aureus)    Past Surgical History:  Procedure Laterality Date  . TONSILLECTOMY    . VASECTOMY     Social History   Socioeconomic History  . Marital status: Married    Spouse name: Not on file  . Number of children: Not on file  . Years of education: Not on file  . Highest education level: Not on file  Occupational History  . Occupation: Disabled  Social Needs  . Financial resource strain: Not on file  . Food insecurity:    Worry: Not on file    Inability: Not on file  . Transportation needs:    Medical: Not on file    Non-medical: Not on file  Tobacco Use  . Smoking status: Former Research scientist (life sciences)  . Smokeless tobacco: Never Used  Substance and  Sexual Activity  . Alcohol use: Yes    Comment: occ  . Drug use: No  . Sexual activity: Not on file  Lifestyle  . Physical activity:    Days per week: Not on file    Minutes per session: Not on file  . Stress: Not on file  Relationships  . Social connections:    Talks on phone: Not on file    Gets together: Not on file    Attends religious service: Not on file    Active member of club or organization: Not on file    Attends meetings of clubs or organizations: Not on file    Relationship status: Not on file  Other Topics Concern  . Not on file  Social History Narrative  . Not on file   Family History  Problem Relation Age of Onset  . Cancer Other   . Diabetes Other   . Lung cancer Father   . Diabetes Maternal Grandmother         Review of Systems: Pertinent positive and negative review of systems were noted in the above HPI section. All other review of systems were otherwise negative.    Physical Exam: General: Well developed, well nourished, morbidly obese, no acute distress Head: Normocephalic and atraumatic Eyes:  sclerae anicteric, EOMI Ears: Normal auditory acuity Mouth: No deformity or lesions Neck: Supple, no masses or thyromegaly Lungs: Clear throughout to auscultation Heart: Regular rate and rhythm; no murmurs, rubs or bruits Abdomen: Soft, non tender and non distended. No masses, hepatosplenomegaly or hernias noted. Normal Bowel sounds Rectal: Deferred to colonoscopy Musculoskeletal: Symmetrical with no gross deformities  Skin: No lesions on visible extremities Pulses:  Normal pulses noted Extremities: No clubbing, cyanosis, edema or deformities noted Neurological: Alert oriented x 4, grossly nonfocal Cervical Nodes:  No significant cervical adenopathy Inguinal Nodes: No significant inguinal adenopathy Psychological:  Alert and cooperative. Normal mood and affect   Assessment and Recommendations:  1. Microcytic anemia, rectal bleeding, mild constipation.  Rule out iron deficiency.  Rule out colorectal neoplasms, ulcer, etc. Obtain CBC, Fe, TIBC, ferritin, B12, folate. Schedule colonoscopy and EGD. The risks (including bleeding, perforation, infection, missed lesions, medication reactions and possible hospitalization or surgery if complications occur), benefits, and alternatives to colonoscopy with possible biopsy and possible polypectomy were discussed with the patient and they consent to proceed. The risks (including bleeding, perforation, infection, missed lesions, medication reactions and possible hospitalization or surgery if complications occur), benefits, and alternatives to endoscopy with possible biopsy and possible dilation were discussed with the patient and they consent to proceed.   2.  Morbid obesity, BMI=51.53.  Recommended long-term weight loss program supervised by his PCP.    cc: Randall Jewel, MD [address] [place], [place] [address]

## 2018-01-16 ENCOUNTER — Other Ambulatory Visit: Payer: Self-pay

## 2018-01-16 ENCOUNTER — Encounter (HOSPITAL_COMMUNITY): Payer: Self-pay | Admitting: *Deleted

## 2018-01-16 ENCOUNTER — Ambulatory Visit (HOSPITAL_COMMUNITY): Payer: Medicare Other | Admitting: Certified Registered Nurse Anesthetist

## 2018-01-16 ENCOUNTER — Encounter (HOSPITAL_COMMUNITY)
Admission: RE | Disposition: A | Discharge status: Home / Self Care | Payer: Self-pay | Source: Ambulatory Visit | Attending: Gastroenterology

## 2018-01-16 ENCOUNTER — Ambulatory Visit (HOSPITAL_COMMUNITY)
Admission: RE | Admit: 2018-01-16 | Discharge: 2018-01-16 | Disposition: A | Discharge status: Home / Self Care | Payer: Medicare Other | Source: Ambulatory Visit | Attending: Gastroenterology | Admitting: Gastroenterology

## 2018-01-16 DIAGNOSIS — Z888 Allergy status to other drugs, medicaments and biological substances status: Secondary | ICD-10-CM | POA: Insufficient documentation

## 2018-01-16 DIAGNOSIS — Z880 Allergy status to penicillin: Secondary | ICD-10-CM | POA: Insufficient documentation

## 2018-01-16 DIAGNOSIS — Z809 Family history of malignant neoplasm, unspecified: Secondary | ICD-10-CM | POA: Diagnosis not present

## 2018-01-16 DIAGNOSIS — Z6841 Body Mass Index (BMI) 40.0 and over, adult: Secondary | ICD-10-CM | POA: Diagnosis not present

## 2018-01-16 DIAGNOSIS — K3189 Other diseases of stomach and duodenum: Secondary | ICD-10-CM | POA: Diagnosis not present

## 2018-01-16 DIAGNOSIS — Z882 Allergy status to sulfonamides status: Secondary | ICD-10-CM | POA: Insufficient documentation

## 2018-01-16 DIAGNOSIS — K269 Duodenal ulcer, unspecified as acute or chronic, without hemorrhage or perforation: Secondary | ICD-10-CM | POA: Insufficient documentation

## 2018-01-16 DIAGNOSIS — K2970 Gastritis, unspecified, without bleeding: Secondary | ICD-10-CM | POA: Diagnosis not present

## 2018-01-16 DIAGNOSIS — Z8614 Personal history of Methicillin resistant Staphylococcus aureus infection: Secondary | ICD-10-CM | POA: Insufficient documentation

## 2018-01-16 DIAGNOSIS — Z87891 Personal history of nicotine dependence: Secondary | ICD-10-CM | POA: Diagnosis not present

## 2018-01-16 DIAGNOSIS — D175 Benign lipomatous neoplasm of intra-abdominal organs: Secondary | ICD-10-CM

## 2018-01-16 DIAGNOSIS — Z881 Allergy status to other antibiotic agents status: Secondary | ICD-10-CM | POA: Insufficient documentation

## 2018-01-16 DIAGNOSIS — Z79899 Other long term (current) drug therapy: Secondary | ICD-10-CM | POA: Diagnosis not present

## 2018-01-16 DIAGNOSIS — D509 Iron deficiency anemia, unspecified: Secondary | ICD-10-CM

## 2018-01-16 DIAGNOSIS — Z833 Family history of diabetes mellitus: Secondary | ICD-10-CM | POA: Insufficient documentation

## 2018-01-16 DIAGNOSIS — I10 Essential (primary) hypertension: Secondary | ICD-10-CM | POA: Diagnosis not present

## 2018-01-16 DIAGNOSIS — K5730 Diverticulosis of large intestine without perforation or abscess without bleeding: Secondary | ICD-10-CM | POA: Diagnosis not present

## 2018-01-16 DIAGNOSIS — Z7984 Long term (current) use of oral hypoglycemic drugs: Secondary | ICD-10-CM | POA: Insufficient documentation

## 2018-01-16 DIAGNOSIS — K2950 Unspecified chronic gastritis without bleeding: Secondary | ICD-10-CM | POA: Diagnosis not present

## 2018-01-16 DIAGNOSIS — K5900 Constipation, unspecified: Secondary | ICD-10-CM | POA: Insufficient documentation

## 2018-01-16 DIAGNOSIS — K921 Melena: Secondary | ICD-10-CM

## 2018-01-16 DIAGNOSIS — E119 Type 2 diabetes mellitus without complications: Secondary | ICD-10-CM | POA: Diagnosis not present

## 2018-01-16 DIAGNOSIS — K635 Polyp of colon: Secondary | ICD-10-CM | POA: Insufficient documentation

## 2018-01-16 DIAGNOSIS — Z801 Family history of malignant neoplasm of trachea, bronchus and lung: Secondary | ICD-10-CM | POA: Diagnosis not present

## 2018-01-16 DIAGNOSIS — D125 Benign neoplasm of sigmoid colon: Secondary | ICD-10-CM

## 2018-01-16 DIAGNOSIS — D124 Benign neoplasm of descending colon: Secondary | ICD-10-CM

## 2018-01-16 DIAGNOSIS — D500 Iron deficiency anemia secondary to blood loss (chronic): Secondary | ICD-10-CM

## 2018-01-16 HISTORY — PX: ESOPHAGOGASTRODUODENOSCOPY (EGD) WITH PROPOFOL: SHX5813

## 2018-01-16 HISTORY — PX: COLONOSCOPY WITH PROPOFOL: SHX5780

## 2018-01-16 HISTORY — PX: BIOPSY: SHX5522

## 2018-01-16 HISTORY — PX: POLYPECTOMY: SHX5525

## 2018-01-16 LAB — GLUCOSE, CAPILLARY: GLUCOSE-CAPILLARY: 170 mg/dL — AB (ref 70–99)

## 2018-01-16 SURGERY — ESOPHAGOGASTRODUODENOSCOPY (EGD) WITH PROPOFOL
Anesthesia: Monitor Anesthesia Care

## 2018-01-16 SURGERY — Surgical Case
Anesthesia: *Unknown

## 2018-01-16 MED ORDER — PROPOFOL 10 MG/ML IV BOLUS
INTRAVENOUS | Status: AC
  Filled 2018-01-16: qty 60

## 2018-01-16 MED ORDER — PROPOFOL 10 MG/ML IV BOLUS
INTRAVENOUS | Status: AC
  Filled 2018-01-16: qty 20

## 2018-01-16 MED ORDER — SODIUM CHLORIDE 0.9 % IV SOLN: INTRAVENOUS | Status: DC

## 2018-01-16 MED ORDER — PROPOFOL 500 MG/50ML IV EMUL
INTRAVENOUS | Status: DC | PRN
  Administered 2018-01-16: 100 ug/kg/min via INTRAVENOUS

## 2018-01-16 MED ORDER — LACTATED RINGERS IV SOLN
INTRAVENOUS | Status: DC
  Administered 2018-01-16: 09:00:00 via INTRAVENOUS

## 2018-01-16 MED ORDER — PROPOFOL 10 MG/ML IV BOLUS
INTRAVENOUS | Status: DC | PRN
  Administered 2018-01-16 (×3): 20 mg via INTRAVENOUS
  Administered 2018-01-16: 10 mg via INTRAVENOUS
  Administered 2018-01-16: 20 mg via INTRAVENOUS

## 2018-01-16 MED ORDER — PANTOPRAZOLE SODIUM 40 MG PO TBEC: 40.0000 mg | DELAYED_RELEASE_TABLET | Freq: Every day | ORAL | 11 refills | Status: DC

## 2018-01-16 SURGICAL SUPPLY — 24 items

## 2018-01-16 NOTE — Op Note (Signed)
[facility] Patient Name: Randall Morgan Procedure Date: 01/16/2018 MRN: [id number] Attending MD: Ladene Artist , MD Date of Birth: [birth year] CSN: [id number] Age: 62 Admit Type: Outpatient Procedure:                Colonoscopy Indications:              Hematochezia, Unexplained iron deficiency anemia Providers:                Pricilla Riffle. Fuller Plan, MD, Angus Seller, Tinnie Gens,                            Technician, Adair Laundry, CRNA Referring MD:             Antonietta Jewel, MD Medicines:                Monitored Anesthesia Care Complications:            No immediate complications. Estimated blood loss:                            None. Estimated Blood Loss:     Estimated blood loss: none. Procedure:                Pre-Anesthesia Assessment:                           - Prior to the procedure, a History and Physical                            was performed, and patient medications and                            allergies were reviewed. The patient's tolerance of                            previous anesthesia was also reviewed. The risks                            and benefits of the procedure and the sedation                            options and risks were discussed with the patient.                            All questions were answered, and informed consent                            was obtained. Prior Anticoagulants: The patient has                            taken no previous anticoagulant or antiplatelet                            agents. ASA Grade Assessment: III - A patient with  severe systemic disease. After reviewing the risks                            and benefits, the patient was deemed in                            satisfactory condition to undergo the procedure.                           After obtaining informed consent, the colonoscope                            was passed under direct vision. Throughout the         procedure, the patient's blood pressure, pulse, and                            oxygen saturations were monitored continuously. The                            [id number]-H190DL ([id number]) Olympus peds colonoscope was                            introduced through the anus and advanced to the the                            cecum, identified by appendiceal orifice and                            ileocecal valve. The ileocecal valve, appendiceal                            orifice, and rectum were photographed. The quality                            of the bowel preparation was good. The colonoscopy                            was performed without difficulty. The patient                            tolerated the procedure well. Scope In: 10:45:34 AM Scope Out: 11:07:04 AM Scope Withdrawal Time: 0 hours 19 minutes 54 seconds  Total Procedure Duration: 0 hours 21 minutes 30 seconds  Findings:      The perianal and digital rectal examinations were normal.      Two sessile polyps were found in the sigmoid colon and descending colon.       The polyps were 6 mm in size. These polyps were removed with a cold       snare. Resection and retrieval were complete.      There was a large submucosal lesion that had features of a lipoma, 30 mm       in diameter, in the distal transverse colon. Biopsies were taken with a       cold forceps for histology.      Multiple  medium-mouthed diverticula were found in the descending colon       and transverse colon.      The exam was otherwise without abnormality on direct and retroflexion       views. Impression:               - Two 6 mm polyps in the sigmoid colon and in the                            descending colon, removed with a cold snare.                            Resected and retrieved.                           - Diverticulosis in the descending colon and in the                            transverse colon.                           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 Biopsied.                           - The examination was otherwise normal on direct                            and retroflexion views. Moderate Sedation:      N/A- Per Anesthesia Care Recommendation:           - Repeat colonoscopy in 5 years for surveillance if                            polyp(s) are precancerous, otherwise 10 years.                           - Patient has a contact number available for                            emergencies. The signs and symptoms of potential                            delayed complications were discussed with the                            patient. Return to normal activities tomorrow.                            Written discharge instructions were provided to the                            patient.                           - High fiber diet.                           -  Continue present medications.                           - Await pathology results.                           - Proceed with EGD today. Procedure Code(s):        --- Professional ---                           [phone], Colonoscopy, flexible; with removal of                            tumor(s), polyp(s), or other lesion(s) by snare                            technique                           45380, 105, Colonoscopy, flexible; with biopsy,                            single or multiple Diagnosis Code(s):        --- Professional ---                           D12.5, Benign neoplasm of sigmoid colon                           D12.4, Benign neoplasm of descending colon                           D17.5, Benign lipomatous neoplasm of                            intra-abdominal organs                           K92.1, Melena (includes Hematochezia)                           D50.9, Iron deficiency anemia, unspecified                           K57.30, Diverticulosis of large intestine without                            perforation or abscess without bleeding CPT copyright 2018  American Medical Association. All rights reserved. The codes documented in this report are preliminary and upon coder review may  be revised to meet current compliance requirements. Ladene Artist, MD 01/16/2018 11:21:38 AM This report has been signed electronically. Number of Addenda: 0

## 2018-01-16 NOTE — Anesthesia Preprocedure Evaluation (Signed)
Anesthesia Evaluation  Patient identified by MRN, date of birth, ID band Patient awake    Reviewed: Allergy & Precautions, NPO status , Patient's Chart, lab work & pertinent test results, reviewed documented beta blocker date and time   Airway Mallampati: I  TM Distance: >3 FB Neck ROM: Full    Dental  (+) Teeth Intact, Dental Advisory Given   Pulmonary former smoker,    breath sounds clear to auscultation       Cardiovascular hypertension, Pt. on medications and Pt. on home beta blockers  Rhythm:Regular Rate:Normal     Neuro/Psych negative neurological ROS     GI/Hepatic negative GI ROS, Neg liver ROS,   Endo/Other  diabetes, Type 2, Oral Hypoglycemic Agents  Renal/GU negative Renal ROS     Musculoskeletal   Abdominal Normal abdominal exam  (+)   Peds  Hematology negative hematology ROS (+)   Anesthesia Other Findings   Reproductive/Obstetrics                            Anesthesia Physical Anesthesia Plan  ASA: II  Anesthesia Plan: MAC   Post-op Pain Management:    Induction: Intravenous  PONV Risk Score and Plan: 1 and Ondansetron and Propofol infusion  Airway Management Planned: Natural Airway and Nasal Cannula  Additional Equipment: None  Intra-op Plan:   Post-operative Plan:   Informed Consent: I have reviewed the patients History and Physical, chart, labs and discussed the procedure including the risks, benefits and alternatives for the proposed anesthesia with the patient or authorized representative who has indicated his/her understanding and acceptance.     Plan Discussed with: CRNA  Anesthesia Plan Comments:         Anesthesia Quick Evaluation

## 2018-01-16 NOTE — Transfer of Care (Signed)
Immediate Anesthesia Transfer of Care Note  Patient: Randall Morgan  Procedure(s) Performed: ESOPHAGOGASTRODUODENOSCOPY (EGD) WITH PROPOFOL (N/A ) COLONOSCOPY WITH PROPOFOL (N/A ) BIOPSY POLYPECTOMY  Patient Location: Endoscopy Unit  Anesthesia Type:MAC  Level of Consciousness: drowsy and patient cooperative  Airway & Oxygen Therapy: Patient Spontanous Breathing and Patient connected to face mask oxygen  Post-op Assessment: Report given to RN and Post -op Vital signs reviewed and stable  Post vital signs: Reviewed and stable  Last Vitals:  Vitals Value Taken Time  BP    Temp    Pulse 77 01/16/2018 11:23 AM  Resp 19 01/16/2018 11:23 AM  SpO2 100 % 01/16/2018 11:23 AM  Vitals shown include unvalidated device data.  Last Pain:  Vitals:   01/16/18 0905  TempSrc: Oral  PainSc: 0-No pain         Complications: No apparent anesthesia complications

## 2018-01-16 NOTE — H&P (Signed)
History of Present Illness: This is a 62 year old male referred by Antonietta Jewel, MD for the evaluation of a microcytic anemia, occasional rectal bleeding, occasional constipation. Iron deficiency anemia confirmed. Takes meloxicam and vicodin daily.  He notes occasional mild constipation and occasional small amounts of rectal bleeding with hard, constipated stools.  No other gastrointestinal complaints.  No prior colonoscopy. Denies weight loss, abdominal pain, diarrhea, change in stool caliber, melena, nausea, vomiting, dysphagia, reflux symptoms, chest pain.        Allergies  Allergen Reactions  . Azithromycin   . Levaquin [Levofloxacin]   . Penicillins   . Septra [Sulfamethoxazole-Trimethoprim]          Outpatient Medications Prior to Visit  Medication Sig Dispense Refill  . amLODipine (NORVASC) 10 MG tablet Take 10 mg by mouth daily.    . carvedilol (COREG) 3.125 MG tablet Take 3.125 mg by mouth 2 (two) times daily with a meal.    . fenofibrate (TRICOR) 145 MG tablet Take 145 mg by mouth daily.    Marland Kitchen gabapentin (NEURONTIN) 100 MG capsule Take 100 mg by mouth 2 (two) times daily.    . Garlic 10 MG CAPS Take by mouth daily.    Marland Kitchen glipiZIDE (GLUCOTROL) 5 MG tablet Take 2.5 mg by mouth 2 (two) times daily before a meal.    . hydrALAZINE (APRESOLINE) 50 MG tablet Take 50 mg by mouth 3 (three) times daily.    . hydrochlorothiazide (HYDRODIURIL) 25 MG tablet Take 25 mg by mouth daily.    Marland Kitchen HYDROcodone-acetaminophen (NORCO/VICODIN) 5-325 MG tablet Take 1 tablet by mouth every 6 (six) hours as needed for moderate pain.    Marland Kitchen loratadine (CLARITIN) 10 MG tablet Take 10 mg by mouth daily.    . meloxicam (MOBIC) 15 MG tablet Take 15 mg by mouth daily.    . metFORMIN (GLUCOPHAGE) 1000 MG tablet Take 1,000 mg by mouth 2 (two) times daily with a meal.    . Multiple Vitamin (MULTIVITAMIN) capsule Take 1 capsule by mouth daily.    . quinapril (ACCUPRIL) 20 MG tablet Take  20 mg by mouth at bedtime.    . colesevelam (WELCHOL) 625 MG tablet Take 1,875 mg by mouth 2 (two) times daily with a meal.    . doxycycline (VIBRAMYCIN) 100 MG capsule Take 1 capsule (100 mg total) by mouth 2 (two) times daily. One po bid x 7 days 10 capsule 0  . NIFEdipine (PROCARDIA-XL/ADALAT CC) 30 MG 24 hr tablet Take 30 mg by mouth daily.     No facility-administered medications prior to visit.        Past Medical History:  Diagnosis Date  . Cellulitis   . Diabetes mellitus without complication ([place])   . Hypertension   . MRSA (methicillin resistant Staphylococcus aureus)         Past Surgical History:  Procedure Laterality Date  . TONSILLECTOMY    . VASECTOMY     Social History        Socioeconomic History  . Marital status: Married    Spouse name: Not on file  . Number of children: Not on file  . Years of education: Not on file  . Highest education level: Not on file  Occupational History  . Occupation: Disabled  Social Needs  . Financial resource strain: Not on file  . Food insecurity:    Worry: Not on file    Inability: Not on file  . Transportation needs:    Medical: Not on  file    Non-medical: Not on file  Tobacco Use  . Smoking status: Former Research scientist (life sciences)  . Smokeless tobacco: Never Used  Substance and Sexual Activity  . Alcohol use: Yes    Comment: occ  . Drug use: No  . Sexual activity: Not on file  Lifestyle  . Physical activity:    Days per week: Not on file    Minutes per session: Not on file  . Stress: Not on file  Relationships  . Social connections:    Talks on phone: Not on file    Gets together: Not on file    Attends religious service: Not on file    Active member of club or organization: Not on file    Attends meetings of clubs or organizations: Not on file    Relationship status: Not on file  Other Topics Concern  . Not on file  Social History Narrative  . Not on file        Family  History  Problem Relation Age of Onset  . Cancer Other   . Diabetes Other   . Lung cancer Father   . Diabetes Maternal Grandmother       Review of Systems: Pertinent positive and negative review of systems were noted in the above HPI section. All other review of systems were otherwise negative.    Physical Exam: General: Well developed, well nourished, morbidly obese, no acute distress Head: Normocephalic and atraumatic Eyes:  sclerae anicteric, EOMI Ears: Normal auditory acuity Mouth: No deformity or lesions Neck: Supple, no masses or thyromegaly Lungs: Clear throughout to auscultation Heart: Regular rate and rhythm; no murmurs, rubs or bruits Abdomen: Soft, non tender and non distended. No masses, hepatosplenomegaly or hernias noted. Normal Bowel sounds Rectal: Deferred to colonoscopy Musculoskeletal: Symmetrical with no gross deformities  Skin: No lesions on visible extremities Pulses:  Normal pulses noted Extremities: No clubbing, cyanosis, edema or deformities noted Neurological: Alert oriented x 4, grossly nonfocal Cervical Nodes:  No significant cervical adenopathy Inguinal Nodes: No significant inguinal adenopathy Psychological:  Alert and cooperative. Normal mood and affect   Assessment and Recommendations:  1. Iron deficiency anemia, rectal bleeding, mild constipation.  Rule out iron deficiency.  Rule out colorectal neoplasms, ulcer, etc. Obtain CBC, Fe, TIBC, ferritin, B12, folate. Schedule colonoscopy and EGD. The risks (including bleeding, perforation, infection, missed lesions, medication reactions and possible hospitalization or surgery if complications occur), benefits, and alternatives to colonoscopy with possible biopsy and possible polypectomy were discussed with the patient and they consent to proceed. The risks (including bleeding, perforation, infection, missed lesions, medication reactions and possible hospitalization or surgery if complications  occur), benefits, and alternatives to endoscopy with possible biopsy and possible dilation were discussed with the patient and they consent to proceed.   2. Morbid obesity, BMI=51.53.  Recommended long-term weight loss program supervised by his PCP.

## 2018-01-16 NOTE — Anesthesia Postprocedure Evaluation (Signed)
Anesthesia Post Note  Patient: Randall Morgan  Procedure(s) Performed: ESOPHAGOGASTRODUODENOSCOPY (EGD) WITH PROPOFOL (N/A ) COLONOSCOPY WITH PROPOFOL (N/A ) BIOPSY POLYPECTOMY     Patient location during evaluation: PACU Anesthesia Type: MAC Level of consciousness: awake and alert Pain management: pain level controlled Vital Signs Assessment: post-procedure vital signs reviewed and stable Respiratory status: spontaneous breathing, nonlabored ventilation, respiratory function stable and patient connected to nasal cannula oxygen Cardiovascular status: blood pressure returned to baseline and stable Postop Assessment: no apparent nausea or vomiting Anesthetic complications: no    Last Vitals:  Vitals:   01/16/18 1135 01/16/18 1145  BP: 139/85 (!) 155/76  Pulse: 77 77  Resp: 19 19  Temp:    SpO2: 97% 98%    Last Pain:  Vitals:   01/16/18 1145  TempSrc:   PainSc: 0-No pain                 Effie Berkshire

## 2018-01-16 NOTE — Op Note (Signed)
[facility] Patient Name: Randall Morgan Procedure Date: 01/16/2018 MRN: [id number] Attending MD: Ladene Artist , MD Date of Birth: 01/28/1956 CSN: [id number] Age: 62 Admit Type: Outpatient Procedure:                Upper GI endoscopy Indications:              Unexplained iron deficiency anemia Providers:                Pricilla Riffle. Fuller Plan, MD, Angus Seller, Tinnie Gens,                            Technician, Adair Laundry, CRNA Referring MD:             Antonietta Jewel, MD Medicines:                Monitored Anesthesia Care Complications:            No immediate complications. Estimated Blood Loss:     Estimated blood loss was minimal. Procedure:                Pre-Anesthesia Assessment:                           - Prior to the procedure, a History and Physical                            was performed, and patient medications and                            allergies were reviewed. The patient's tolerance of                            previous anesthesia was also reviewed. The risks                            and benefits of the procedure and the sedation                            options and risks were discussed with the patient.                            All questions were answered, and informed consent                            was obtained. Prior Anticoagulants: The patient has                            taken no previous anticoagulant or antiplatelet                            agents. ASA Grade Assessment: III - A patient with                            severe systemic disease. After reviewing the risks  and benefits, the patient was deemed in                            satisfactory condition to undergo the procedure.                           After obtaining informed consent, the endoscope was                            passed under direct vision. Throughout the                            procedure, the patient's blood pressure, pulse, and                             oxygen saturations were monitored continuously. The                            GIF-H190 ([id number]) Olympus adult endoscope was                            introduced through the mouth, and advanced to the                            second part of duodenum. The upper GI endoscopy was                            accomplished without difficulty. The patient                            tolerated the procedure well. Scope In: Scope Out: Findings:      The examined esophagus was normal.      Multiple dispersed, small non-bleeding erosions were found in the       gastric body and in the gastric antrum. There were no stigmata of recent       bleeding. Biopsies were taken with a cold forceps for histology.      The exam of the stomach was otherwise normal.      Two non-bleeding cratered duodenal ulcers with no stigmata of bleeding       were found in the duodenal bulb. The largest lesion was 7 mm in largest       dimension.      The exam of the duodenum was otherwise normal. Impression:               - Normal esophagus.                           - Non-bleeding erosive gastropathy. Biopsied.                           - Two non-bleeding duodenal ulcers with no stigmata                            of bleeding. Moderate Sedation:      N/A- Per Anesthesia Care Recommendation:           -  Patient has a contact number available for                            emergencies. The signs and symptoms of potential                            delayed complications were discussed with the                            patient. Return to normal activities tomorrow.                            Written discharge instructions were provided to the                            patient.                           - Resume previous diet.                           - Continue present medications.                           - Await pathology results.                           - No aspirin, ibuprofen,  naproxen, or other                            non-steroidal anti-inflammatory drugs indefinitely.                           - Discontinue Mobic.                           - Iron deficiency is likely from chronic erosions,                            ulcers.                           - Protonix (pantoprazole) 40 mg PO daily                            indefinitely and follow up with PCP for ongoing                            care.                           - Continue FeSO4 325 mg po bid and follow up with                            PCP for ongoing care. Procedure Code(s):        --- Professional ---  88416, Esophagogastroduodenoscopy, flexible,                            transoral; with biopsy, single or multiple Diagnosis Code(s):        --- Professional ---                           K31.89, Other diseases of stomach and duodenum                           K26.9, Duodenal ulcer, unspecified as acute or                            chronic, without hemorrhage or perforation                           D50.9, Iron deficiency anemia, unspecified CPT copyright 2018 American Medical Association. All rights reserved. The codes documented in this report are preliminary and upon coder review may  be revised to meet current compliance requirements. Ladene Artist, MD 01/16/2018 11:28:34 AM This report has been signed electronically. Number of Addenda: 0

## 2018-01-16 NOTE — Discharge Instructions (Signed)
YOU HAD AN ENDOSCOPIC PROCEDURE TODAY: Refer to the procedure report and other information in the discharge instructions given to you for any specific questions about what was found during the examination. If this information does not answer your questions, please call [place] office at [phone] to clarify.  ° °YOU SHOULD EXPECT: Some feelings of bloating in the abdomen. Passage of more gas than usual. Walking can help get rid of the air that was put into your GI tract during the procedure and reduce the bloating. If you had a lower endoscopy (such as a colonoscopy or flexible sigmoidoscopy) you may notice spotting of blood in your stool or on the toilet paper. Some abdominal soreness may be present for a day or two, also. ° °DIET: Your first meal following the procedure should be a light meal and then it is ok to progress to your normal diet. A half-sandwich or bowl of soup is an example of a good first meal. Heavy or fried foods are harder to digest and may make you feel nauseous or bloated. Drink plenty of fluids but you should avoid alcoholic beverages for 24 hours. If you had a esophageal dilation, please see attached instructions for diet.   ° °ACTIVITY: Your care partner should take you home directly after the procedure. You should plan to take it easy, moving slowly for the rest of the day. You can resume normal activity the day after the procedure however YOU SHOULD NOT DRIVE, use power tools, machinery or perform tasks that involve climbing or major physical exertion for 24 hours (because of the sedation medicines used during the test).  ° °SYMPTOMS TO REPORT IMMEDIATELY: °A gastroenterologist can be reached at any hour. Please call [phone]  for any of the following symptoms:  °Following lower endoscopy (colonoscopy, flexible sigmoidoscopy) °Excessive amounts of blood in the stool  °Significant tenderness, worsening of abdominal pains  °Swelling of the abdomen that is new, acute  °Fever of 100° or  higher  °Following upper endoscopy (EGD, EUS, ERCP, esophageal dilation) °Vomiting of blood or coffee ground material  °New, significant abdominal pain  °New, significant chest pain or pain under the shoulder blades  °Painful or persistently difficult swallowing  °New shortness of breath  °Black, tarry-looking or red, bloody stools ° °FOLLOW UP:  °If any biopsies were taken you will be contacted by phone or by letter within the next 1-3 weeks. Call [phone]  if you have not heard about the biopsies in 3 weeks.  °Please also call with any specific questions about appointments or follow up tests. ° °

## 2018-01-18 ENCOUNTER — Encounter (HOSPITAL_COMMUNITY): Payer: Self-pay | Admitting: Gastroenterology

## 2018-01-23 ENCOUNTER — Encounter: Payer: Self-pay | Admitting: Gastroenterology

## 2018-03-13 IMAGING — DX DG FINGER THUMB 2+V*R*
3 series · 3 of 3 positions shown · non-contrast
Comparison: None.

CLINICAL DATA: Thumb laceration due to dog leash.

EXAM:
RIGHT THUMB 2+V

[finger ap]
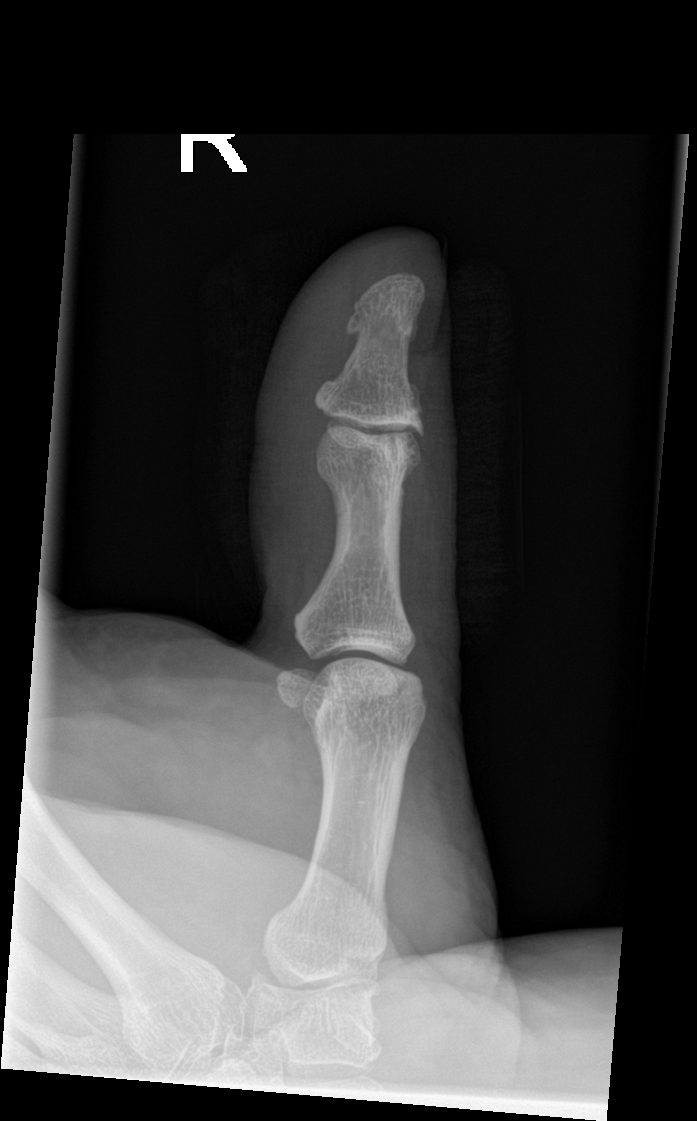

[finger obl]
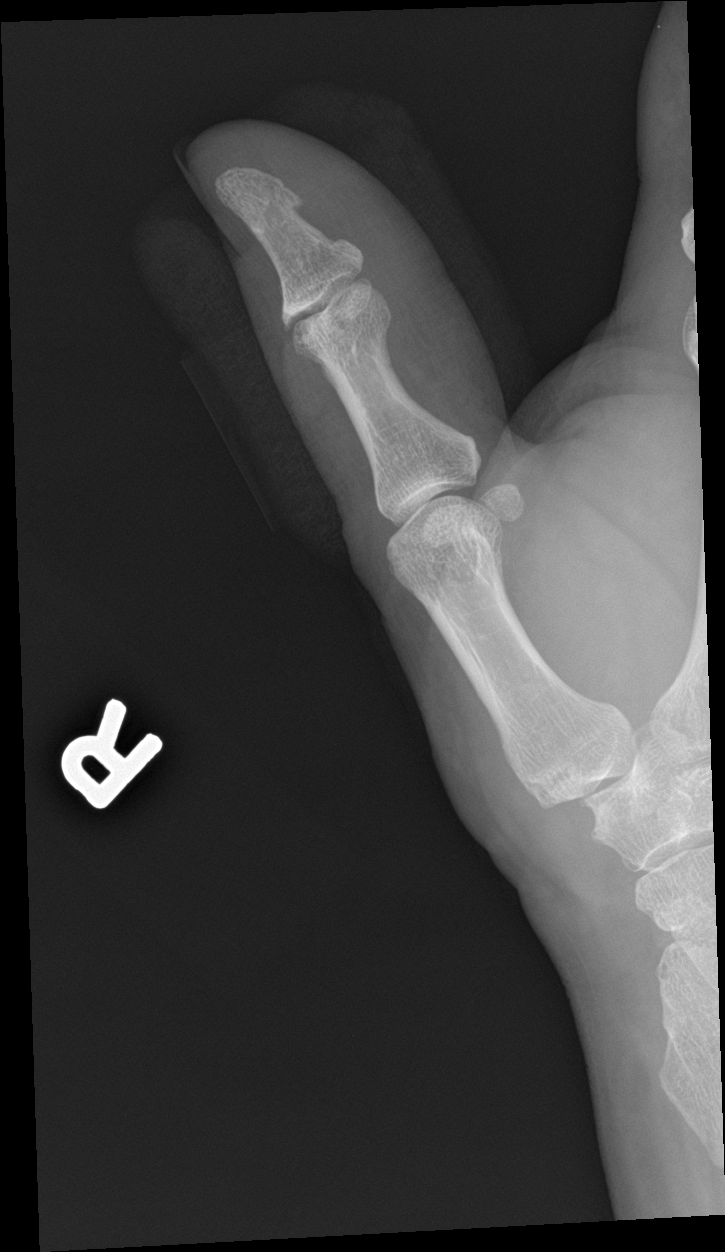

[finger lat]
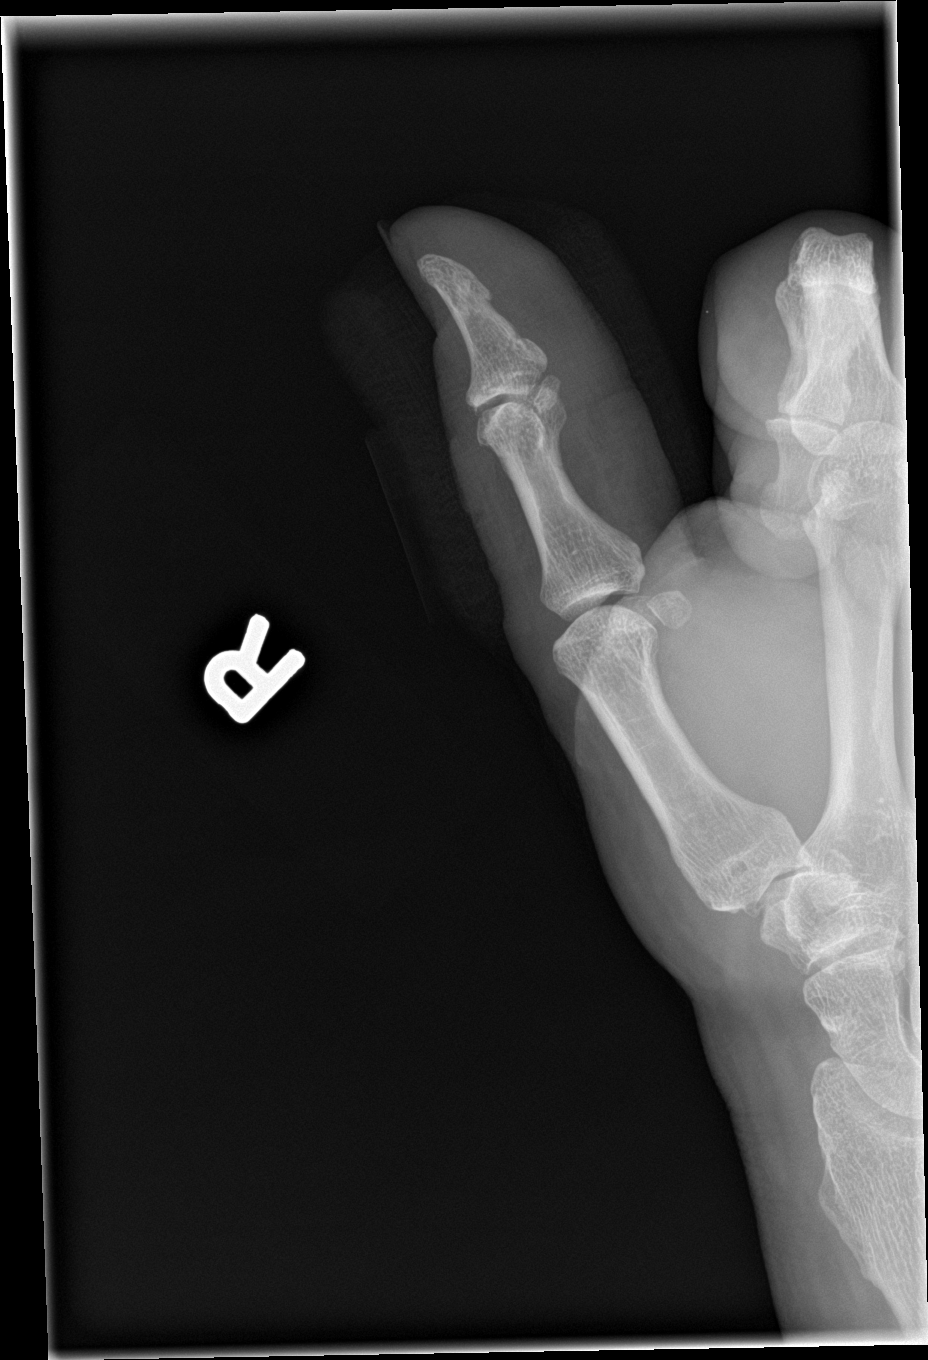

[3 of 3 positions shown; findings below may reference images not displayed]

FINDINGS: There is no evidence of fracture or dislocation. There is no
evidence of arthropathy or other focal bone abnormality. Bandage
over the first digit without subcutaneous gas or radiopaque foreign
bodies.
IMPRESSION: Negative.

## 2018-03-26 ENCOUNTER — Encounter (HOSPITAL_BASED_OUTPATIENT_CLINIC_OR_DEPARTMENT_OTHER): Payer: Self-pay | Admitting: Emergency Medicine

## 2018-03-26 ENCOUNTER — Emergency Department (HOSPITAL_BASED_OUTPATIENT_CLINIC_OR_DEPARTMENT_OTHER)
Admission: EM | Admit: 2018-03-26 | Discharge: 2018-03-27 | Disposition: A | Discharge status: Home / Self Care | Payer: Medicare Other | Attending: Emergency Medicine | Admitting: Emergency Medicine

## 2018-03-26 ENCOUNTER — Other Ambulatory Visit: Payer: Self-pay

## 2018-03-26 DIAGNOSIS — Z79899 Other long term (current) drug therapy: Secondary | ICD-10-CM | POA: Diagnosis not present

## 2018-03-26 DIAGNOSIS — J069 Acute upper respiratory infection, unspecified: Secondary | ICD-10-CM

## 2018-03-26 DIAGNOSIS — E119 Type 2 diabetes mellitus without complications: Secondary | ICD-10-CM | POA: Insufficient documentation

## 2018-03-26 DIAGNOSIS — Z7984 Long term (current) use of oral hypoglycemic drugs: Secondary | ICD-10-CM | POA: Diagnosis not present

## 2018-03-26 DIAGNOSIS — Z87891 Personal history of nicotine dependence: Secondary | ICD-10-CM | POA: Insufficient documentation

## 2018-03-26 DIAGNOSIS — I10 Essential (primary) hypertension: Secondary | ICD-10-CM | POA: Insufficient documentation

## 2018-03-26 DIAGNOSIS — R0981 Nasal congestion: Secondary | ICD-10-CM | POA: Diagnosis present

## 2018-03-26 NOTE — ED Triage Notes (Signed)
Pt c/o sinus congestion, green snot, and cough for approx 3-4 days. Denies fever, N/V or diarrhea. Pt took alka seltzer plus 1 hour pta and goody powders. States they seem to help.

## 2018-03-26 NOTE — ED Provider Notes (Signed)
[facility] Provider Note   CSN: [id number] Arrival date & time: 03/26/18  2205     History   Chief Complaint Chief Complaint  Patient presents with  . Nasal Congestion  . Cough    HPI Randall Morgan is a 62 y.o. male.  Patient is a 62 year old male with past medical history of hypertension, diabetes.  He presents today for evaluation of chest congestion and cough.  This is been ongoing for the past 3 days.  His cough has been intermittently productive.  He denies any chest pain or difficulty breathing.  He denies any fever.  He reports being on an antibiotic a month ago with a similar illness.  There are several other family members in his household ill with similar symptoms.  The history is provided by the patient.  Cough  This is a new problem. Episode onset: 3 days ago. The problem occurs constantly. The problem has been gradually worsening. The cough is productive of sputum. There has been no fever.    Past Medical History:  Diagnosis Date  . Cellulitis   . Diabetes mellitus without complication ([place])   . Hypertension   . MRSA (methicillin resistant Staphylococcus aureus)     Patient Active Problem List   Diagnosis Date Noted  . Iron deficiency anemia due to chronic blood loss   . Hematochezia   . Benign neoplasm of sigmoid colon   . Benign neoplasm of descending colon   . Lipoma of colon     Past Surgical History:  Procedure Laterality Date  . BIOPSY  01/16/2018   Procedure: BIOPSY;  Surgeon: Ladene Artist, MD;  Location: [facility];  Service: Endoscopy;;  . COLONOSCOPY WITH PROPOFOL N/A 01/16/2018   Procedure: COLONOSCOPY WITH PROPOFOL;  Surgeon: Ladene Artist, MD;  Location: [facility];  Service: Endoscopy;  Laterality: N/A;  . ESOPHAGOGASTRODUODENOSCOPY (EGD) WITH PROPOFOL N/A 01/16/2018   Procedure: ESOPHAGOGASTRODUODENOSCOPY (EGD) WITH PROPOFOL;  Surgeon: Ladene Artist, MD;  Location: [facility];  Service:  Endoscopy;  Laterality: N/A;  . POLYPECTOMY  01/16/2018   Procedure: POLYPECTOMY;  Surgeon: Ladene Artist, MD;  Location: [facility];  Service: Endoscopy;;  . TONSILLECTOMY    . VASECTOMY          Home Medications    Prior to Admission medications   Medication Sig Start Date End Date Taking? Authorizing Provider  amLODipine (NORVASC) 10 MG tablet Take 10 mg by mouth daily.    [provider]  Aspirin-Acetaminophen-Caffeine (GOODY HEADACHE PO) Take 1 packet by mouth daily as needed (for pain or headache).    [provider]  carvedilol (COREG) 3.125 MG tablet Take 3.125 mg by mouth 2 (two) times daily with a meal.    [provider]  fenofibrate (TRICOR) 145 MG tablet Take 145 mg by mouth daily.    [provider]  ferrous sulfate 325 (65 FE) MG tablet Take 325 mg by mouth 2 (two) times daily with a meal.    [provider]  gabapentin (NEURONTIN) 400 MG capsule Take 400 mg by mouth 3 (three) times daily.     [provider]  Garlic 8469 MG CAPS Take 1,000 mg by mouth daily.     [provider]  glipiZIDE (GLUCOTROL) 5 MG tablet Take 5 mg by mouth 2 (two) times daily before a meal.     [provider]  hydrALAZINE (APRESOLINE) 50 MG tablet Take 50 mg by mouth 3 (three) times daily.  [provider]  hydrochlorothiazide (HYDRODIURIL) 25 MG tablet Take 25 mg by mouth daily.    [provider]  HYDROcodone-acetaminophen (NORCO/VICODIN) 5-325 MG tablet Take 1 tablet by mouth 3 (three) times daily as needed for moderate pain.     [provider]  loratadine (CLARITIN) 10 MG tablet Take 10 mg by mouth daily.    [provider]  metFORMIN (GLUCOPHAGE) 1000 MG tablet Take 250 mg by mouth 2 (two) times daily with a meal.     [provider]  Multiple Vitamin (MULTIVITAMIN) capsule Take 1 capsule by mouth daily.    [provider]  pantoprazole (PROTONIX) 40 MG tablet  Take 1 tablet (40 mg total) by mouth daily. 01/16/18   Ladene Artist, MD  quinapril (ACCUPRIL) 20 MG tablet Take 20 mg by mouth at bedtime.    [provider]    Family History Family History  Problem Relation Age of Onset  . Cancer Other   . Diabetes Other   . Lung cancer Father   . Diabetes Maternal Grandmother     Social History Social History   Tobacco Use  . Smoking status: Former Research scientist (life sciences)  . Smokeless tobacco: Never Used  Substance Use Topics  . Alcohol use: Yes    Comment: occ  . Drug use: No     Allergies   Azithromycin; Levaquin [levofloxacin]; Penicillins; and Septra [sulfamethoxazole-trimethoprim]   Review of Systems Review of Systems  Respiratory: Positive for cough.   All other systems reviewed and are negative.    Physical Exam Updated Vital Signs BP 135/73   Pulse 82   Temp 98.4 F (36.9 C) (Oral)   Resp 18   Ht 5\' 6"  (1.676 m)   Wt (!) 141.5 kg   SpO2 96%   BMI 50.36 kg/m   Physical Exam Vitals signs and nursing note reviewed.  Constitutional:      General: He is not in acute distress.    Appearance: He is well-developed. He is not diaphoretic.  HENT:     Head: Normocephalic and atraumatic.     Right Ear: Tympanic membrane normal.     Left Ear: Tympanic membrane normal.     Mouth/Throat:     Mouth: Mucous membranes are moist.     Pharynx: No oropharyngeal exudate or posterior oropharyngeal erythema.  Neck:     Musculoskeletal: Normal range of motion and neck supple.  Cardiovascular:     Rate and Rhythm: Normal rate and regular rhythm.     Heart sounds: No murmur. No friction rub.  Pulmonary:     Effort: Pulmonary effort is normal. No respiratory distress.     Breath sounds: Normal breath sounds. No wheezing or rales.  Abdominal:     General: Bowel sounds are normal. There is no distension.     Palpations: Abdomen is soft.     Tenderness: There is no abdominal tenderness.  Musculoskeletal: Normal range of motion.    Skin:    General: Skin is warm and dry.  Neurological:     Mental Status: He is alert and oriented to person, place, and time.     Coordination: Coordination normal.      ED Treatments / Results  Labs (all labs ordered are listed, but only abnormal results are displayed) Labs Reviewed - No data to display  EKG None  Radiology No results found.  Procedures Procedures (including critical care time)  Medications Ordered in ED Medications - No data to display  Initial Impression / Assessment and Plan / ED Course  I have reviewed the triage vital signs and the nursing notes.  Pertinent labs & imaging results that were available during my care of the patient were reviewed by me and considered in my medical decision making (see chart for details).  Patient with history of diabetes presenting with complaints of chest congestion and cough.  This is been ongoing for the past several days.  Symptoms are likely viral in nature, however due to the patient's comorbidities, he will be prescribed an antibiotic.  If he is not improving in the next 1 to 2 days, he can get this filled.  He is to continue over-the-counter medications for now and return as needed if he worsens.  Final Clinical Impressions(s) / ED Diagnoses   Final diagnoses:  None    ED Discharge Orders    None       Veryl Speak, MD 03/27/18 [phone]

## 2018-03-27 MED ORDER — DOXYCYCLINE HYCLATE 100 MG PO CAPS: 100.0000 mg | ORAL_CAPSULE | Freq: Two times a day (BID) | ORAL | 0 refills | Status: AC

## 2018-03-27 NOTE — Discharge Instructions (Addendum)
Continue over-the-counter medications as needed for relief of symptoms.  If you worsen or do not improve in the next 2 days, fill the prescription for doxycycline you have been provided with this evening.

## 2019-01-10 ENCOUNTER — Other Ambulatory Visit: Payer: Self-pay

## 2019-01-10 MED ORDER — PANTOPRAZOLE SODIUM 40 MG PO TBEC: 40.0000 mg | DELAYED_RELEASE_TABLET | Freq: Every day | ORAL | 0 refills | Status: AC
